# Patient Record
Sex: Male | Born: 1955 | Race: Black or African American | Hispanic: No | Marital: Single | Smoking: Never smoker
Health system: Southern US, Community
[De-identification: ages and names within clinical notes are randomized; demographics above are authoritative.]

## PROBLEM LIST (undated history)

## (undated) DIAGNOSIS — N201 Calculus of ureter: Secondary | ICD-10-CM

## (undated) DIAGNOSIS — R002 Palpitations: Secondary | ICD-10-CM

## (undated) DIAGNOSIS — I10 Essential (primary) hypertension: Secondary | ICD-10-CM

## (undated) DIAGNOSIS — G039 Meningitis, unspecified: Secondary | ICD-10-CM

---

## 2004-07-07 ENCOUNTER — Emergency Department: Payer: Self-pay | Admitting: Emergency Medicine

## 2005-07-11 ENCOUNTER — Other Ambulatory Visit: Payer: Self-pay

## 2005-07-11 ENCOUNTER — Observation Stay: Payer: Self-pay

## 2005-12-30 ENCOUNTER — Emergency Department: Payer: Self-pay | Admitting: Emergency Medicine

## 2005-12-30 ENCOUNTER — Other Ambulatory Visit: Payer: Self-pay

## 2009-03-05 ENCOUNTER — Emergency Department: Payer: Self-pay | Admitting: Emergency Medicine

## 2009-06-07 ENCOUNTER — Emergency Department: Payer: Self-pay | Admitting: Emergency Medicine

## 2009-10-06 ENCOUNTER — Emergency Department: Payer: Self-pay | Admitting: Internal Medicine

## 2009-11-13 ENCOUNTER — Emergency Department: Payer: Self-pay | Admitting: Internal Medicine

## 2009-11-25 ENCOUNTER — Emergency Department: Payer: Self-pay | Admitting: Internal Medicine

## 2011-02-21 IMAGING — CR DG ABDOMEN 1V
1 series · 1 of 1 positions shown · non-contrast
Comparison: none

REASON FOR EXAM: r flank pain
COMMENTS:

[view not recorded]
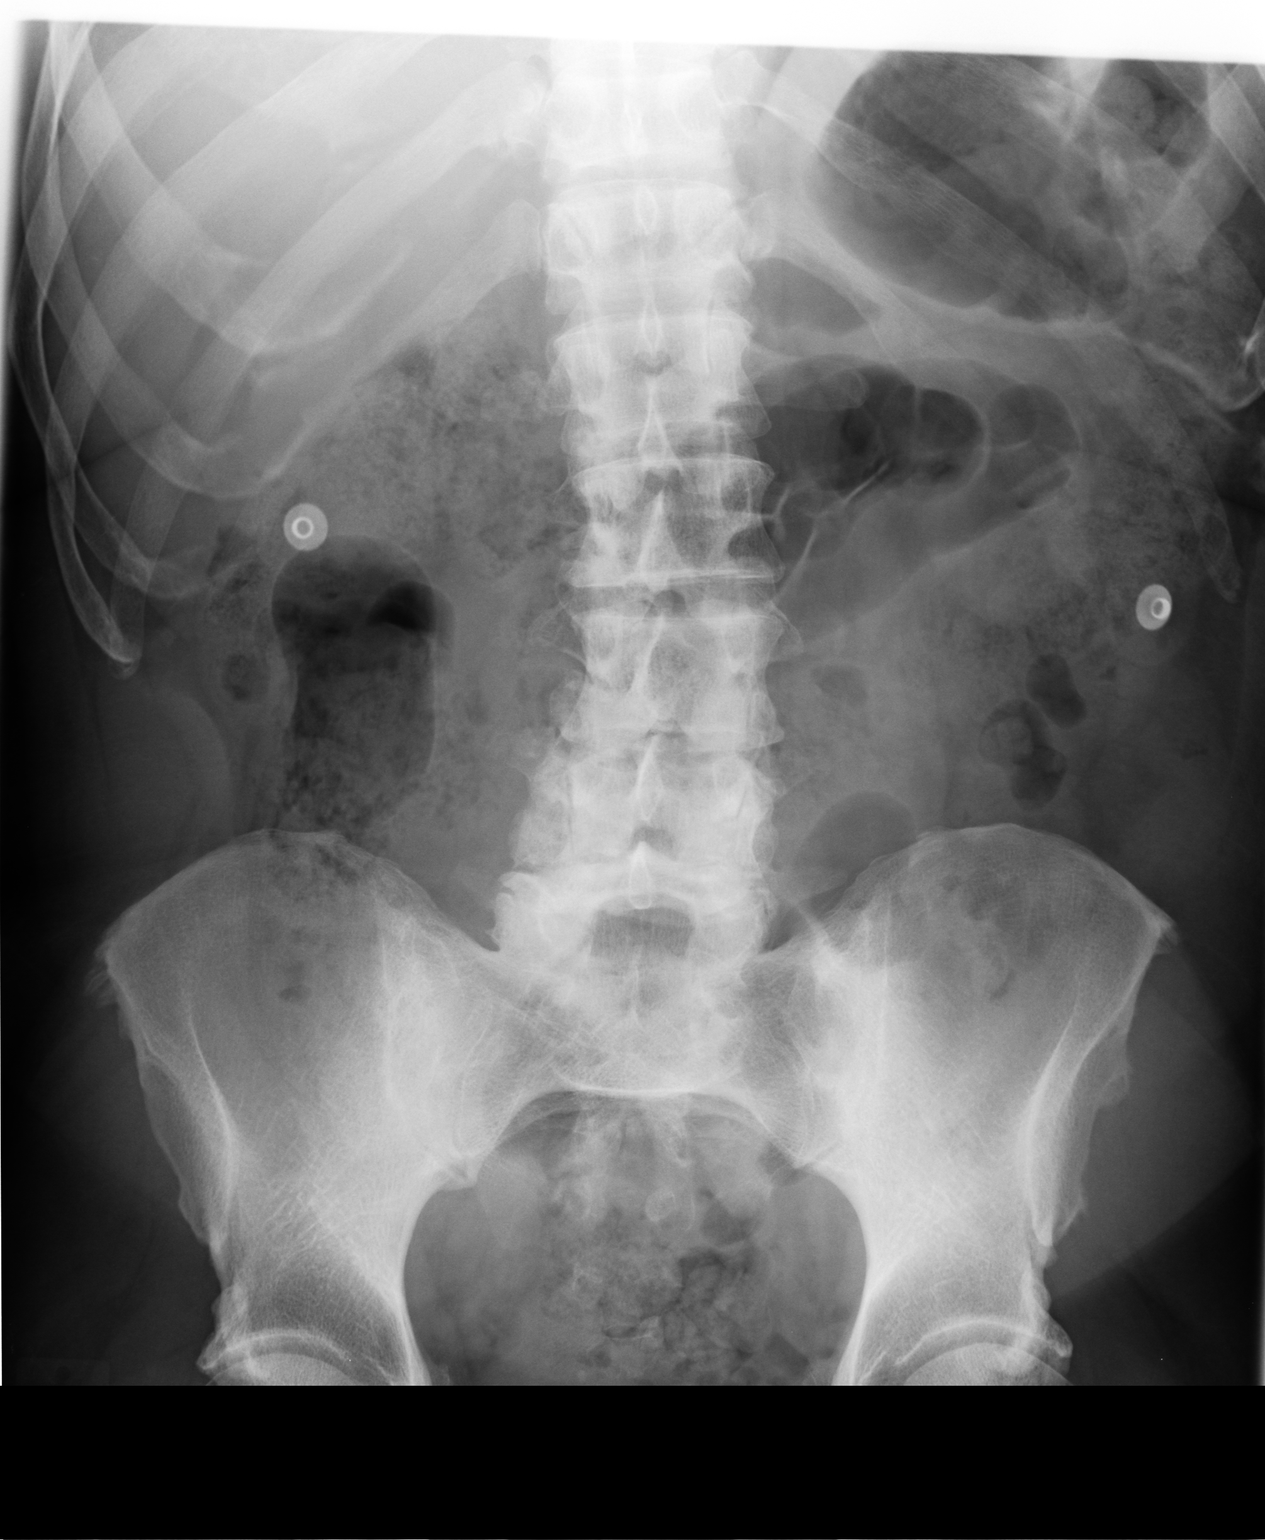

[1 of 1 positions shown; findings below may reference images not displayed]

PROCEDURE:     DXR - DXR KIDNEY URETER BLADDER  - November 13, 2009 [DATE]

RESULT:     The bowel gas pattern is relatively nonspecific. There is likely
an element of constipation present. I do not see abnormal calcifications in
the region of the kidneys. There are degenerative changes of the lower
lumbar spine. The SI joints appear sclerotic.
IMPRESSION: The bowel gas pattern suggests constipation. I see no
evidence of calcified kidney stones.

## 2011-02-21 IMAGING — CR DG CHEST 1V PORT
1 series · 1 of 1 positions shown · non-contrast
Comparison: none

REASON FOR EXAM: Chest Pain
COMMENTS:

[view not recorded]
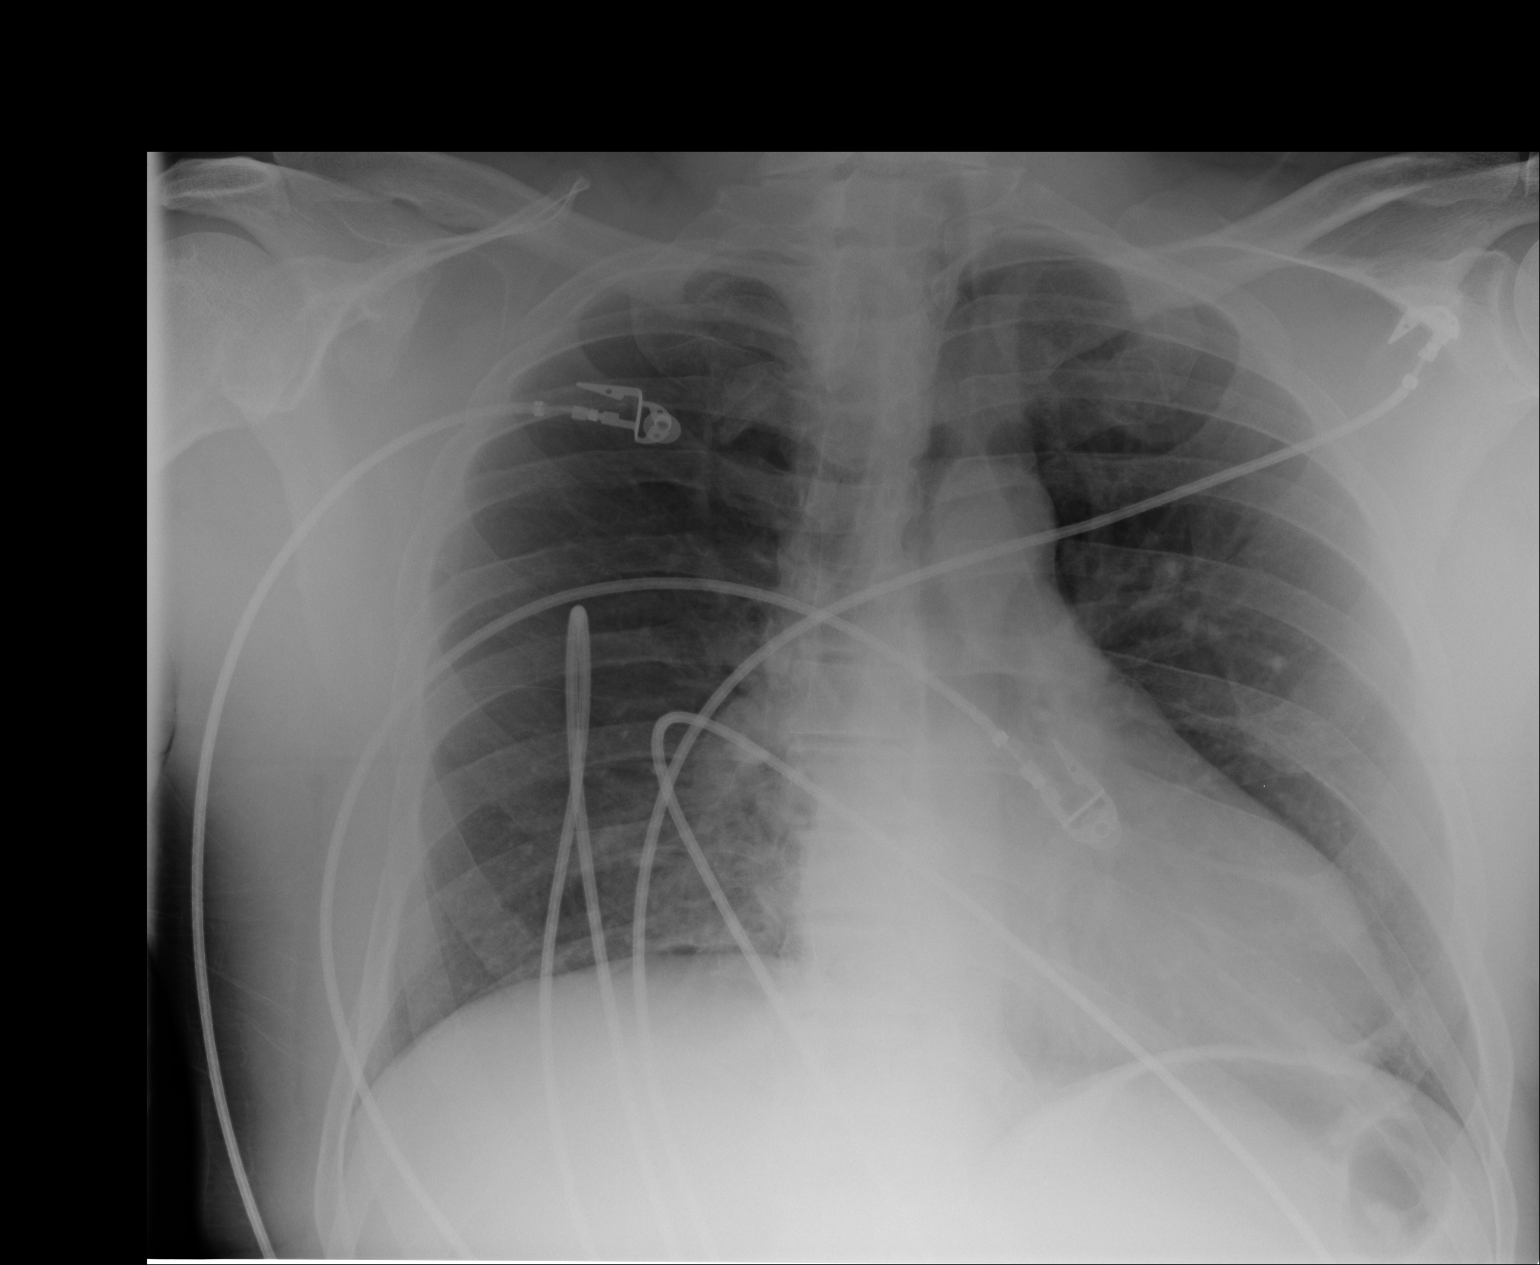

[1 of 1 positions shown; findings below may reference images not displayed]

PROCEDURE:     DXR - DXR PORTABLE CHEST SINGLE VIEW  - November 13, 2009 [DATE]

RESULT:     Comparison is made to a study 05 March, 2009.

The lungs are adequately inflated. The interstitial markings are mildly
prominent in the perihilar regions and in the right infrahilar region. The
cardiac silhouette is not enlarged. The pulmonary vascularity is not
engorged.
IMPRESSION: The perihilar lung markings and right infrahilar lung
markings are prominent. This may reflect subsegmental atelectasis. I see no
overt evidence of CHF nor of pneumonia.

## 2011-08-12 ENCOUNTER — Emergency Department: Payer: Self-pay | Admitting: Emergency Medicine

## 2012-11-30 ENCOUNTER — Emergency Department (HOSPITAL_COMMUNITY): Payer: Worker's Compensation

## 2012-11-30 ENCOUNTER — Encounter (HOSPITAL_COMMUNITY): Payer: Self-pay

## 2012-11-30 ENCOUNTER — Emergency Department (HOSPITAL_COMMUNITY)
Admission: EM | Admit: 2012-11-30 | Discharge: 2012-11-30 | Disposition: A | Discharge status: Home / Self Care | Payer: Worker's Compensation | Attending: Emergency Medicine | Admitting: Emergency Medicine

## 2012-11-30 DIAGNOSIS — K089 Disorder of teeth and supporting structures, unspecified: Secondary | ICD-10-CM | POA: Insufficient documentation

## 2012-11-30 DIAGNOSIS — Z7982 Long term (current) use of aspirin: Secondary | ICD-10-CM | POA: Insufficient documentation

## 2012-11-30 DIAGNOSIS — S161XXA Strain of muscle, fascia and tendon at neck level, initial encounter: Secondary | ICD-10-CM

## 2012-11-30 DIAGNOSIS — K047 Periapical abscess without sinus: Secondary | ICD-10-CM | POA: Insufficient documentation

## 2012-11-30 DIAGNOSIS — Z79899 Other long term (current) drug therapy: Secondary | ICD-10-CM | POA: Insufficient documentation

## 2012-11-30 DIAGNOSIS — S060X9A Concussion with loss of consciousness of unspecified duration, initial encounter: Secondary | ICD-10-CM | POA: Insufficient documentation

## 2012-11-30 DIAGNOSIS — Y929 Unspecified place or not applicable: Secondary | ICD-10-CM | POA: Insufficient documentation

## 2012-11-30 DIAGNOSIS — S139XXA Sprain of joints and ligaments of unspecified parts of neck, initial encounter: Secondary | ICD-10-CM | POA: Insufficient documentation

## 2012-11-30 DIAGNOSIS — S069X9A Unspecified intracranial injury with loss of consciousness of unspecified duration, initial encounter: Secondary | ICD-10-CM

## 2012-11-30 DIAGNOSIS — X500XXA Overexertion from strenuous movement or load, initial encounter: Secondary | ICD-10-CM | POA: Insufficient documentation

## 2012-11-30 DIAGNOSIS — Y939 Activity, unspecified: Secondary | ICD-10-CM | POA: Insufficient documentation

## 2012-11-30 DIAGNOSIS — S79919A Unspecified injury of unspecified hip, initial encounter: Secondary | ICD-10-CM | POA: Insufficient documentation

## 2012-11-30 DIAGNOSIS — IMO0002 Reserved for concepts with insufficient information to code with codable children: Secondary | ICD-10-CM | POA: Insufficient documentation

## 2012-11-30 DIAGNOSIS — I10 Essential (primary) hypertension: Secondary | ICD-10-CM | POA: Insufficient documentation

## 2012-11-30 HISTORY — DX: Essential (primary) hypertension: I10

## 2012-11-30 MED ORDER — AMOXICILLIN 500 MG PO CAPS: 500.0000 mg | ORAL_CAPSULE | Freq: Three times a day (TID) | ORAL | Status: DC

## 2012-11-30 MED ORDER — HYDROCODONE-ACETAMINOPHEN 5-325 MG PO TABS: 1.0000 | ORAL_TABLET | ORAL | Status: DC | PRN

## 2012-11-30 MED ORDER — AMOXICILLIN 250 MG PO CAPS
500.0000 mg | ORAL_CAPSULE | Freq: Once | ORAL | Status: AC
  Administered 2012-11-30: 500 mg via ORAL
  Filled 2012-11-30: qty 2

## 2012-11-30 MED ORDER — IBUPROFEN 600 MG PO TABS: 600.0000 mg | ORAL_TABLET | Freq: Four times a day (QID) | ORAL | Status: DC | PRN

## 2012-11-30 NOTE — Discharge Instructions (Signed)
Return here for any problems or concerns.  You xrays are ok today.  Take the antibiotic prescribed for your dental infection and plan to see a dentist for definitive treatment of this infected tooth.

## 2012-11-30 NOTE — ED Notes (Signed)
Larey Seat this morning when I got off work around 0600 walking across the parking lot at work. Hit head on the concrete and heard neck pop. States that he blacked out for a minute. States that he is having pain in neck, right hip, and low back.

## 2012-12-03 NOTE — ED Provider Notes (Signed)
History     CSN: [id number]  Arrival date & time 11/30/12  Barry Brunner   First MD Initiated Contact with Patient 11/30/12 2119      Chief Complaint  Patient presents with  . Fall  . Back Pain  . Hip Pain  . Neck Pain    (Consider location/radiation/quality/duration/timing/severity/associated sxs/prior treatment) HPI Comments: James Daugherty is a 57 y.o. Male presenting for evaluation of persistent pain in his neck,  Lower back and right hip after falling on ice 12 hours ago while walking out of work.  He reports a brief loc as witnessed by his coworkers although he also remembers feeling his neck "pop" at the time of the fall.  He has persistent pain and worsening stiffness of his neck and lower back and also reports soreness at his right lateral hip with palpation, but denies pain with weight bearing and range of motion of the hip. He has had no headaches,  Confusion,  Vision changes, dizziness or focal weakness since the event including no numbness or weakness in his upper or lower extremities.  He has maintained a normal appetite today without nausea or vomiting.  He also denies leg numbness,  Weakness and has noted no problems with urination bowel movements, denies saddle anesthesia.  He also reports has had right dental pain and swelling along his right lower jawline which has worsened today.  He denies fevers.  Patient is a 57 y.o. male presenting with back pain, hip pain, and neck pain. The history is provided by the patient.  Back Pain Associated symptoms: no abdominal pain, no chest pain, no dysuria, no fever, no headaches, no numbness and no weakness   Hip Pain Associated symptoms include neck pain. Pertinent negatives include no abdominal pain, chest pain, fever, headaches, joint swelling, numbness, rash, sore throat or weakness.  Neck Pain Associated symptoms: no chest pain, no fever, no headaches, no numbness and no weakness     Past Medical History  Diagnosis Date  .  Hypertension     History reviewed. No pertinent past surgical history.  History reviewed. No pertinent family history.  History  Substance Use Topics  . Smoking status: Never Smoker   . Smokeless tobacco: Not on file  . Alcohol Use: No      Review of Systems  Constitutional: Negative for fever.  HENT: Positive for neck pain and dental problem. Negative for sore throat, facial swelling and neck stiffness.   Respiratory: Negative for shortness of breath.   Cardiovascular: Negative for chest pain and leg swelling.  Gastrointestinal: Negative for abdominal pain, constipation and abdominal distention.  Genitourinary: Negative for dysuria, urgency, frequency, flank pain and difficulty urinating.  Musculoskeletal: Positive for back pain. Negative for joint swelling and gait problem.  Skin: Negative for rash.  Neurological: Negative for dizziness, speech difficulty, weakness, light-headedness, numbness and headaches.    Allergies  Review of patient's allergies indicates no known allergies.  Home Medications   Current Outpatient Rx  Name  Route  Sig  Dispense  Refill  . aspirin EC 81 MG tablet   Oral   Take 81 mg by mouth 2 (two) times daily.         Marland Kitchen ibuprofen (ADVIL,MOTRIN) 200 MG tablet   Oral   Take 1,200 mg by mouth once as needed for pain.         . metoprolol succinate (TOPROL-XL) 25 MG 24 hr tablet   Oral   Take 25 mg by mouth daily.         Marland Kitchen  amoxicillin (AMOXIL) 500 MG capsule   Oral   Take 1 capsule (500 mg total) by mouth 3 (three) times daily.   30 capsule   0   . HYDROcodone-acetaminophen (NORCO/VICODIN) 5-325 MG per tablet   Oral   Take 1 tablet by mouth every 4 (four) hours as needed for pain.   12 tablet   0   . ibuprofen (ADVIL,MOTRIN) 600 MG tablet   Oral   Take 1 tablet (600 mg total) by mouth every 6 (six) hours as needed for pain.   30 tablet   0     BP 143/88  Pulse 75  Temp(Src) 98.2 F (36.8 C) (Oral)  Resp 20  Ht 5\' 10"   (1.778 m)  Wt 185 lb (83.915 kg)  BMI 26.54 kg/m2  SpO2 100%  Physical Exam  Nursing note and vitals reviewed. Constitutional: He is oriented to person, place, and time. He appears well-developed and well-nourished. No distress.  HENT:  Head: Normocephalic and atraumatic. Head is without raccoon's eyes, without Battle's sign and without contusion.  Right Ear: Tympanic membrane and external ear normal.  Left Ear: Tympanic membrane and external ear normal.  Mouth/Throat: Oropharynx is clear and moist and mucous membranes are normal. No oral lesions. Dental abscesses present.    Eyes: Conjunctivae and EOM are normal. Pupils are equal, round, and reactive to light.  Neck: Normal range of motion. Neck supple. Muscular tenderness present. No spinous process tenderness present. No rigidity. Normal range of motion present.  Cardiovascular: Normal rate, normal heart sounds and intact distal pulses.   Pedal pulses normal.  Pulmonary/Chest: Effort normal.  Abdominal: Soft. Bowel sounds are normal. He exhibits no distension and no mass. There is no tenderness.  Musculoskeletal: Normal range of motion. He exhibits no edema.       Right hip: He exhibits normal range of motion, normal strength, no bony tenderness, no swelling, no crepitus and no deformity.       Lumbar back: He exhibits tenderness. He exhibits no bony tenderness, no swelling, no edema and no spasm.  Bilateral paralumbar ttp.  Lymphadenopathy:    He has no cervical adenopathy.  Neurological: He is alert and oriented to person, place, and time. He has normal strength. He displays no atrophy and no tremor. No sensory deficit. Gait normal. GCS eye subscore is 4. GCS verbal subscore is 5. GCS motor subscore is 6.  Reflex Scores:      Patellar reflexes are 2+ on the right side and 2+ on the left side.      Achilles reflexes are 2+ on the right side and 2+ on the left side. No strength deficit noted in hip and knee flexor and extensor  muscle groups.  Ankle flexion and extension intact.  Skin: Skin is warm and dry. No rash noted. No erythema.  Psychiatric: He has a normal mood and affect. His speech is normal and behavior is normal. Thought content normal. Cognition and memory are normal.    ED Course  Procedures (including critical care time)  Labs Reviewed - No data to display No results found.   1. Minor head injury with loss of consciousness, initial encounter   2. Cervical strain, acute, initial encounter   3. Dental abscess       MDM   Patients labs and/or radiological studies were reviewed during the medical decision making and disposition process. No injury per xrays obtained. Discusse pro and con of head Ct scan.  Given injury 12 hours old,  And with no neuro deficits on exam,  No indication for ct at this time.  Discussed with Dr. Deretha Emory who also agrees.  Patient also prefers to not have ct scan unless necessary.  He was prescribed amoxil for his dental abscess,  Also prescribed ibuprofen and hydrocodone.  Referrals given for f/u care.  The patient appears reasonably screened and/or stabilized for discharge and I doubt any other medical condition or other [facility] requiring further screening, evaluation, or treatment in the ED at this time prior to discharge.        Burgess Amor, PA-C 12/03/12 1432

## 2012-12-04 NOTE — ED Provider Notes (Signed)
Medical screening examination/treatment/procedure(s) were performed by non-physician practitioner and as supervising physician I was immediately available for consultation/collaboration.   Scott W. Zackowski, MD 12/04/12 1943 

## 2014-06-23 ENCOUNTER — Encounter (HOSPITAL_COMMUNITY): Payer: Self-pay | Admitting: Emergency Medicine

## 2014-06-23 ENCOUNTER — Emergency Department (HOSPITAL_COMMUNITY)
Admission: EM | Admit: 2014-06-23 | Discharge: 2014-06-23 | Disposition: A | Discharge status: Home / Self Care | Payer: BC Managed Care – PPO | Attending: Emergency Medicine | Admitting: Emergency Medicine

## 2014-06-23 ENCOUNTER — Emergency Department (HOSPITAL_COMMUNITY): Payer: BC Managed Care – PPO

## 2014-06-23 DIAGNOSIS — I10 Essential (primary) hypertension: Secondary | ICD-10-CM | POA: Diagnosis not present

## 2014-06-23 DIAGNOSIS — Z79899 Other long term (current) drug therapy: Secondary | ICD-10-CM | POA: Diagnosis not present

## 2014-06-23 DIAGNOSIS — Z792 Long term (current) use of antibiotics: Secondary | ICD-10-CM | POA: Insufficient documentation

## 2014-06-23 DIAGNOSIS — R109 Unspecified abdominal pain: Secondary | ICD-10-CM | POA: Diagnosis present

## 2014-06-23 DIAGNOSIS — Z87442 Personal history of urinary calculi: Secondary | ICD-10-CM | POA: Insufficient documentation

## 2014-06-23 DIAGNOSIS — Z7982 Long term (current) use of aspirin: Secondary | ICD-10-CM | POA: Diagnosis not present

## 2014-06-23 DIAGNOSIS — N23 Unspecified renal colic: Secondary | ICD-10-CM | POA: Diagnosis not present

## 2014-06-23 HISTORY — DX: Calculus of ureter: N20.1

## 2014-06-23 LAB — URINALYSIS, ROUTINE W REFLEX MICROSCOPIC
BILIRUBIN URINE: NEGATIVE
GLUCOSE, UA: NEGATIVE mg/dL
Ketones, ur: NEGATIVE mg/dL
Leukocytes, UA: NEGATIVE
Nitrite: NEGATIVE
SPECIFIC GRAVITY, URINE: 1.02 (ref 1.005–1.030)
Urobilinogen, UA: 0.2 mg/dL (ref 0.0–1.0)
pH: 5.5 (ref 5.0–8.0)

## 2014-06-23 LAB — URINE MICROSCOPIC-ADD ON

## 2014-06-23 MED ORDER — OXYCODONE-ACETAMINOPHEN 5-325 MG PO TABS: 1.0000 | ORAL_TABLET | ORAL | Status: DC | PRN

## 2014-06-23 NOTE — ED Notes (Signed)
Pt c/o left flank pain

## 2014-06-23 NOTE — ED Provider Notes (Signed)
CSN: [id number]     Arrival date & time 06/23/14  0143 History   First MD Initiated Contact with Patient 06/23/14 on patient's arrival to the room.   Chief Complaint  Patient presents with  . Flank Pain     (Consider location/radiation/quality/duration/timing/severity/associated sxs/prior Treatment) HPI This is a 58 year old male with a history of kidney stones. He is here with left flank pain that began suddenly about 11 PM yesterday evening. The pain was moderate to severe. It is characterized as like previous kidney stone. It was not associated with nausea or vomiting. It was associated with some difficulty urinating and hematuria. He was given fentanyl IV by EMS prior to arrival with complete resolution of his pain. He's been observed in the ED for several hours and continues to remain pain-free.  Past Medical History  Diagnosis Date  . Hypertension   . Ureterolithiasis    History reviewed. No pertinent past surgical history. History reviewed. No pertinent family history. History  Substance Use Topics  . Smoking status: Never Smoker   . Smokeless tobacco: Not on file  . Alcohol Use: No    Review of Systems  All other systems reviewed and are negative.   Allergies  Review of patient's allergies indicates no known allergies.  Home Medications   Prior to Admission medications   Medication Sig Start Date End Date Taking? Authorizing Provider  amoxicillin (AMOXIL) 500 MG capsule Take 1 capsule (500 mg total) by mouth 3 (three) times daily. 11/30/12   Burgess AmorJulie Idol, PA-C  aspirin EC 81 MG tablet Take 81 mg by mouth 2 (two) times daily.    Historical Provider, MD  HYDROcodone-acetaminophen (NORCO/VICODIN) 5-325 MG per tablet Take 1 tablet by mouth every 4 (four) hours as needed for pain. 11/30/12   Burgess AmorJulie Idol, PA-C  ibuprofen (ADVIL,MOTRIN) 200 MG tablet Take 1,200 mg by mouth once as needed for pain.    Historical Provider, MD  ibuprofen (ADVIL,MOTRIN) 600 MG tablet Take 1 tablet  (600 mg total) by mouth every 6 (six) hours as needed for pain. 11/30/12   Burgess AmorJulie Idol, PA-C  metoprolol succinate (TOPROL-XL) 25 MG 24 hr tablet Take 25 mg by mouth daily.    Historical Provider, MD   BP 142/91  Pulse 92  Temp(Src) 97.7 F (36.5 C)  Resp 20  Ht 5\' 9"  (1.753 m)  Wt 195 lb (88.451 kg)  BMI 28.78 kg/m2  SpO2 99%  Physical Exam General: Well-developed, well-nourished male in no acute distress; appearance consistent with age of record HENT: normocephalic; atraumatic Eyes: pupils equal, round and reactive to light; extraocular muscles intact Neck: supple Heart: regular rate and rhythm Lungs: clear to auscultation bilaterally Abdomen: soft; nondistended; nontender; no masses or hepatosplenomegaly; bowel sounds present GU: no CVA tenderness Extremities: No deformity; full range of motion; pulses normal Neurologic: Awake, alert and oriented; motor function intact in all extremities and symmetric; no facial droop Skin: Warm and dry Psychiatric: Normal mood and affect    ED Course  Procedures (including critical care time)  MDM   Nursing notes and vitals signs, including pulse oximetry, reviewed.  Summary of this visit's results, reviewed by myself:  Labs:  Results for orders placed during the hospital encounter of 06/23/14 (from the past 24 hour(s))  URINALYSIS, ROUTINE W REFLEX MICROSCOPIC     Status: Abnormal   Collection Time    06/23/14  1:50 AM      Result Value Ref Range   Color, Urine YELLOW  YELLOW   APPearance  CLEAR  CLEAR   Specific Gravity, Urine 1.020  1.005 - 1.030   pH 5.5  5.0 - 8.0   Glucose, UA NEGATIVE  NEGATIVE mg/dL   Hgb urine dipstick LARGE (*) NEGATIVE   Bilirubin Urine NEGATIVE  NEGATIVE   Ketones, ur NEGATIVE  NEGATIVE mg/dL   Protein, ur TRACE (*) NEGATIVE mg/dL   Urobilinogen, UA 0.2  0.0 - 1.0 mg/dL   Nitrite NEGATIVE  NEGATIVE   Leukocytes, UA NEGATIVE  NEGATIVE  URINE MICROSCOPIC-ADD ON     Status: Abnormal   Collection Time     06/23/14  1:50 AM      Result Value Ref Range   Squamous Epithelial / LPF RARE  RARE   WBC, UA 0-2  <3 WBC/hpf   RBC / HPF TOO NUMEROUS TO COUNT  <3 RBC/hpf   Bacteria, UA MANY (*) RARE   Casts GRANULAR CAST (*) NEGATIVE   Urine-Other MUCOUS PRESENT          Hanley Seamen, MD 06/23/14 [phone]

## 2014-06-23 NOTE — Discharge Instructions (Signed)

## 2015-04-12 ENCOUNTER — Telehealth: Payer: Self-pay

## 2015-04-12 NOTE — Telephone Encounter (Signed)
Patient called with work schedule and he is off aug 3 and 4th    Also off aug 8 and 9th

## 2015-04-12 NOTE — Telephone Encounter (Signed)
[phone]  PATIENT CALLED TO SCHEDULE A COLONOSCOPY   RECEIVED LETTER

## 2015-04-14 NOTE — Telephone Encounter (Signed)
Tried to call pt to give appt date and time and could not leave a message on his phone.

## 2015-04-14 NOTE — Telephone Encounter (Signed)
See separate triage.  

## 2015-04-14 NOTE — Telephone Encounter (Signed)
Gastroenterology Pre-Procedure Review  Request Date: 04/12/2015 Requesting Physician: Karrie DoffingWilliam Selvidge, MD    PATIENT REVIEW QUESTIONS: The patient responded to the following health history questions as indicated:    1. Diabetes Melitis: no 2. Joint replacements in the past 12 months: no 3. Major health problems in the past 3 months: no 4. Has an artificial valve or MVP: no 5. Has a defibrillator: no 6. Has been advised in past to take antibiotics in advance of a procedure like teeth cleaning: no    MEDICATIONS & ALLERGIES:    Patient reports the following regarding taking any blood thinners:   Plavix? no Aspirin? YES Coumadin? no  Patient confirms/reports the following medications:  Current Outpatient Prescriptions  Medication Sig Dispense Refill  . aspirin EC 81 MG tablet Take 81 mg by mouth 2 (two) times daily. Takes one tablet every other day    . b complex vitamins tablet Take 1 tablet by mouth daily.    . folic acid (FOLVITE) 800 MCG tablet Take 400 mcg by mouth daily. Takes one tablet every other day    . Multiple Vitamin (MULTIVITAMIN) tablet Take 1 tablet by mouth daily.    . NON FORMULARY Co Q 10     One daily    . NON FORMULARY Vitamin A    . NON FORMULARY Beta Carotene    . amoxicillin (AMOXIL) 500 MG capsule Take 1 capsule (500 mg total) by mouth 3 (three) times daily. (Patient not taking: Reported on 04/12/2015) 30 capsule 0  . HYDROcodone-acetaminophen (NORCO/VICODIN) 5-325 MG per tablet Take 1 tablet by mouth every 4 (four) hours as needed for pain. (Patient not taking: Reported on 04/12/2015) 12 tablet 0  . ibuprofen (ADVIL,MOTRIN) 200 MG tablet Take 1,200 mg by mouth once as needed for pain.    Marland Kitchen. ibuprofen (ADVIL,MOTRIN) 600 MG tablet Take 1 tablet (600 mg total) by mouth every 6 (six) hours as needed for pain. (Patient not taking: Reported on 04/12/2015) 30 tablet 0  . metoprolol succinate (TOPROL-XL) 25 MG 24 hr tablet Take 25 mg by mouth daily.    Marland Kitchen.  oxyCODONE-acetaminophen (PERCOCET) 5-325 MG per tablet Take 1-2 tablets by mouth every 4 (four) hours as needed (for pain). (Patient not taking: Reported on 04/12/2015) 30 tablet 0   No current facility-administered medications for this visit.    Patient confirms/reports the following allergies:  No Known Allergies  No orders of the defined types were placed in this encounter.    AUTHORIZATION INFORMATION Primary Insurance:   ID #:   Group #:  Pre-Cert / Auth required:  Pre-Cert / Auth #:  Secondary Insurance:  ID #:   Group #:  Pre-Cert / Auth required:  Pre-Cert / Auth #:   SCHEDULE INFORMATION: Procedure has been scheduled as follows:  Date:             Time:   Location:   This Gastroenterology Pre-Precedure Review Form is being routed to the following provider(s): Jonette EvaSandi Fields, MD

## 2015-04-14 NOTE — Telephone Encounter (Signed)
SUPREP SPLIT DOSING- FULL LIQUIDS WITH BREAKFAST.   Full Liquid Diet A high-calorie, high-protein supplement should be used to meet your nutritional requirements when the full liquid diet is continued for more than 2 or 3 days. If this diet is to be used for an extended period of time (more than 7 days), a multivitamin should be considered.  Breads and Starches  Allowed: None are allowed except crackers WHOLE OR pureed (made into a thick, smooth soup) in soup.   Avoid: Any others.    Potatoes/Pasta/Rice  Allowed: ANY ITEM AS A SOUP OR SMALL PLATE OF MASHED POTATOES.       Vegetables  Allowed: Strained tomato or vegetable juice. Vegetables pureed in soup.   Avoid: Any others.    Fruit  Allowed: Any strained fruit juices and fruit drinks. Include 1 serving of citrus or vitamin C-enriched fruit juice daily.   Avoid: Any others.  Meat and Meat Substitutes  Allowed: Egg  Avoid: Any meat, fish, or fowl. All cheese.  Milk  Allowed: Milk beverages, including milk shakes and instant breakfast mixes. Smooth yogurt.   Avoid: Any others. Avoid dairy products if not tolerated.    Soups and Combination Foods  Allowed: Broth, strained cream soups. Strained, broth-based soups.   Avoid: Any others.    Desserts and Sweets  Allowed: flavored gelatin,plain ice cream, sherbet, smooth pudding, junket, fruit ices, frozen ice pops, pudding pops,, frozen fudge pops, chocolate syrup. Sugar, honey, jelly, syrup.   Avoid: Any others.  Fats and Oils  Allowed: Margarine, butter, cream, sour cream, oils.   Avoid: Any others.  Beverages  Allowed: All.   Avoid: None.  Condiments  Allowed: Iodized salt, pepper, spices, flavorings. Cocoa powder.   Avoid: Any others.    SAMPLE MEAL PLAN Breakfast   cup orange juice.   1 OR 2 EGGS   1 cup  milk.   1 cup beverage (coffee or tea).   Cream or sugar, if desired.    Midmorning Snack  2 SCRAMBLED OR HARD BOILED  EGG   Lunch  1 cup cream soup.    cup fruit juice.   1 cup milk.    cup custard.   1 cup beverage (coffee or tea).   Cream or sugar, if desired.    Midafternoon Snack  1 cup milk shake.  Dinner  1 cup cream soup.    cup fruit juice.   1 cup milk.    cup pudding.   1 cup beverage (coffee or tea).   Cream or sugar, if desired.  Evening Snack  1 cup supplement.  To increase calories, add sugar, cream, butter, or margarine if possible. Nutritional supplements will also increase the total calories.  

## 2015-04-20 ENCOUNTER — Other Ambulatory Visit: Payer: Self-pay

## 2015-04-20 DIAGNOSIS — Z1211 Encounter for screening for malignant neoplasm of colon: Secondary | ICD-10-CM

## 2015-04-20 MED ORDER — NA SULFATE-K SULFATE-MG SULF 17.5-3.13-1.6 GM/177ML PO SOLN: 1.0000 | ORAL | Status: DC

## 2015-04-20 NOTE — Telephone Encounter (Signed)
Pt called and has been scheduled for 04/29/2015 with Dr. Darrick Penna at 10:45 AM.  I am sending the Rx to pharmacy and also faxing his instructions to pharmacy.

## 2015-04-23 NOTE — Telephone Encounter (Signed)
Instructions faxed to pharmacy and James Daugherty was aware to put with the prep.

## 2015-04-26 ENCOUNTER — Telehealth: Payer: Self-pay

## 2015-04-26 NOTE — Telephone Encounter (Signed)
I called [employer] @ [phone] and spoke to [facility] who said that a PA is not required for screening colonoscopy done as out patient.

## 2015-04-29 ENCOUNTER — Encounter (HOSPITAL_COMMUNITY): Payer: Self-pay | Admitting: *Deleted

## 2015-04-29 ENCOUNTER — Encounter (HOSPITAL_COMMUNITY)
Admission: RE | Disposition: A | Discharge status: Home / Self Care | Payer: Self-pay | Source: Ambulatory Visit | Attending: Gastroenterology

## 2015-04-29 ENCOUNTER — Ambulatory Visit (HOSPITAL_COMMUNITY)
Admission: RE | Admit: 2015-04-29 | Discharge: 2015-04-29 | Disposition: A | Discharge status: Home / Self Care | Payer: BC Managed Care – PPO | Source: Ambulatory Visit | Attending: Gastroenterology | Admitting: Gastroenterology

## 2015-04-29 DIAGNOSIS — Z1211 Encounter for screening for malignant neoplasm of colon: Secondary | ICD-10-CM | POA: Insufficient documentation

## 2015-04-29 DIAGNOSIS — K648 Other hemorrhoids: Secondary | ICD-10-CM | POA: Insufficient documentation

## 2015-04-29 DIAGNOSIS — Z7982 Long term (current) use of aspirin: Secondary | ICD-10-CM | POA: Diagnosis not present

## 2015-04-29 DIAGNOSIS — I10 Essential (primary) hypertension: Secondary | ICD-10-CM | POA: Diagnosis not present

## 2015-04-29 DIAGNOSIS — K5730 Diverticulosis of large intestine without perforation or abscess without bleeding: Secondary | ICD-10-CM

## 2015-04-29 HISTORY — DX: Palpitations: R00.2

## 2015-04-29 HISTORY — DX: Meningitis, unspecified: G03.9

## 2015-04-29 HISTORY — PX: COLONOSCOPY: SHX5424

## 2015-04-29 SURGERY — COLONOSCOPY
Anesthesia: Moderate Sedation

## 2015-04-29 MED ORDER — STERILE WATER FOR IRRIGATION IR SOLN
Status: DC | PRN
  Administered 2015-04-29: 10:00:00

## 2015-04-29 MED ORDER — SODIUM CHLORIDE 0.9 % IV SOLN
INTRAVENOUS | Status: DC
  Administered 2015-04-29: 10:00:00 via INTRAVENOUS

## 2015-04-29 MED ORDER — MIDAZOLAM HCL 5 MG/5ML IJ SOLN
INTRAMUSCULAR | Status: DC | PRN
  Administered 2015-04-29 (×2): 2 mg via INTRAVENOUS
  Administered 2015-04-29: 1 mg via INTRAVENOUS

## 2015-04-29 MED ORDER — MEPERIDINE HCL 100 MG/ML IJ SOLN
INTRAMUSCULAR | Status: DC | PRN
  Administered 2015-04-29: 50 mg via INTRAVENOUS
  Administered 2015-04-29: 25 mg via INTRAVENOUS

## 2015-04-29 MED ORDER — MEPERIDINE HCL 100 MG/ML IJ SOLN
INTRAMUSCULAR | Status: AC
  Filled 2015-04-29: qty 2

## 2015-04-29 MED ORDER — MIDAZOLAM HCL 5 MG/5ML IJ SOLN
INTRAMUSCULAR | Status: AC
  Filled 2015-04-29: qty 10

## 2015-04-29 NOTE — Discharge Instructions (Signed)
You have small internal hemorrhoids and diverticulosis IN YOUR RIGHT AND LEFT COLON.   DRINK WATER TO KEEP YOUR URINE LIGHT YELLOW.  FOLLOW A HIGH FIBER DIET. AVOID ITEMS THAT CAUSE BLOATING & GAS. See info below.  Next colonoscopy in 10 years.  Colonoscopy Care After Read the instructions outlined below and refer to this sheet in the next week. These discharge instructions provide you with general information on caring for yourself after you leave the hospital. While your treatment has been planned according to the most current medical practices available, unavoidable complications occasionally occur. If you have any problems or questions after discharge, call DR. Mahogany Torrance, [phone].  ACTIVITY  You may resume your regular activity, but move at a slower pace for the next 24 hours.   Take frequent rest periods for the next 24 hours.   Walking will help get rid of the air and reduce the bloated feeling in your belly (abdomen).   No driving for 24 hours (because of the medicine (anesthesia) used during the test).   You may shower.   Do not sign any important legal documents or operate any machinery for 24 hours (because of the anesthesia used during the test).    NUTRITION  Drink plenty of fluids.   You may resume your normal diet as instructed by your doctor.   Begin with a light meal and progress to your normal diet. Heavy or fried foods are harder to digest and may make you feel sick to your stomach (nauseated).   Avoid alcoholic beverages for 24 hours or as instructed.    MEDICATIONS  You may resume your normal medications.   WHAT YOU CAN EXPECT TODAY  Some feelings of bloating in the abdomen.   Passage of more gas than usual.   Spotting of blood in your stool or on the toilet paper  .  IF YOU HAD POLYPS REMOVED DURING THE COLONOSCOPY:  Eat a soft diet IF YOU HAVE NAUSEA, BLOATING, ABDOMINAL PAIN, OR VOMITING.    FINDING OUT THE RESULTS OF YOUR TEST Not  all test results are available during your visit. DR. Darrick Penna WILL CALL YOU WITHIN 7 DAYS OF YOUR PROCEDUE WITH YOUR RESULTS. Do not assume everything is normal if you have not heard from DR. Tukker Byrns IN ONE WEEK, CALL HER OFFICE AT [phone].  SEEK IMMEDIATE MEDICAL ATTENTION AND CALL THE OFFICE: [phone] IF:  You have more than a spotting of blood in your stool.   Your belly is swollen (abdominal distention).   You are nauseated or vomiting.   You have a temperature over 101F.   You have abdominal pain or discomfort that is severe or gets worse throughout the day.  High-Fiber Diet A high-fiber diet changes your normal diet to include more whole grains, legumes, fruits, and vegetables. Changes in the diet involve replacing refined carbohydrates with unrefined foods. The calorie level of the diet is essentially unchanged. The Dietary Reference Intake (recommended amount) for adult males is 38 grams per day. For adult females, it is 25 grams per day. Pregnant and lactating women should consume 28 grams of fiber per day. Fiber is the intact part of a plant that is not broken down during digestion. Functional fiber is fiber that has been isolated from the plant to provide a beneficial effect in the body. PURPOSE  Increase stool bulk.   Ease and regulate bowel movements.   Lower cholesterol.  REDUCE RISK OF COLON CANCER  INDICATIONS THAT YOU NEED MORE FIBER  Constipation  and hemorrhoids.   Uncomplicated diverticulosis (intestine condition) and irritable bowel syndrome.   Weight management.   As a protective measure against hardening of the arteries (atherosclerosis), diabetes, and cancer.   GUIDELINES FOR INCREASING FIBER IN THE DIET  Start adding fiber to the diet slowly. A gradual increase of about 5 more grams (2 slices of whole-wheat bread, 2 servings of most fruits or vegetables, or 1 bowl of high-fiber cereal) per day is best. Too rapid an increase in fiber may result in  constipation, flatulence, and bloating.   Drink enough water and fluids to keep your urine clear or pale yellow. Water, juice, or caffeine-free drinks are recommended. Not drinking enough fluid may cause constipation.   Eat a variety of high-fiber foods rather than one type of fiber.   Try to increase your intake of fiber through using high-fiber foods rather than fiber pills or supplements that contain small amounts of fiber.   The goal is to change the types of food eaten. Do not supplement your present diet with high-fiber foods, but replace foods in your present diet.   INCLUDE A VARIETY OF FIBER SOURCES  Replace refined and processed grains with whole grains, canned fruits with fresh fruits, and incorporate other fiber sources. White rice, white breads, and most bakery goods contain little or no fiber.   Brown whole-grain rice, buckwheat oats, and many fruits and vegetables are all good sources of fiber. These include: broccoli, Brussels sprouts, cabbage, cauliflower, beets, sweet potatoes, white potatoes (skin on), carrots, tomatoes, eggplant, squash, berries, fresh fruits, and dried fruits.   Cereals appear to be the richest source of fiber. Cereal fiber is found in whole grains and bran. Bran is the fiber-rich outer coat of cereal grain, which is largely removed in refining. In whole-grain cereals, the bran remains. In breakfast cereals, the largest amount of fiber is found in those with "bran" in their names. The fiber content is sometimes indicated on the label.   You may need to include additional fruits and vegetables each day.   In baking, for 1 cup white flour, you may use the following substitutions:   1 cup whole-wheat flour minus 2 tablespoons.   1/2 cup white flour plus 1/2 cup whole-wheat flour.   Diverticulosis Diverticulosis is a common condition that develops when small pouches (diverticula) form in the wall of the colon. The risk of diverticulosis increases with age.  It happens more often in people who eat a low-fiber diet. Most individuals with diverticulosis have no symptoms. Those individuals with symptoms usually experience belly (abdominal) pain, constipation, or loose stools (diarrhea).  HOME CARE INSTRUCTIONS  Increase the amount of fiber in your diet as directed by your caregiver or dietician. This may reduce symptoms of diverticulosis.   Drink at least 6 to 8 glasses of water each day to prevent constipation.   Try not to strain when you have a bowel movement.   Avoiding nuts and seeds to prevent complications is NOT NECESSARY.     FOODS HAVING HIGH FIBER CONTENT INCLUDE:  Fruits. Apple, peach, pear, tangerine, raisins, prunes.   Vegetables. Brussels sprouts, asparagus, broccoli, cabbage, carrot, cauliflower, romaine lettuce, spinach, summer squash, tomato, winter squash, zucchini.   Starchy Vegetables. Baked beans, kidney beans, lima beans, split peas, lentils, potatoes (with skin).   Grains. Whole wheat bread, brown rice, bran flake cereal, plain oatmeal, white rice, shredded wheat, bran muffins.   SEEK IMMEDIATE MEDICAL CARE IF:  You develop increasing pain or severe bloating.  You have an oral temperature above 101F.   You develop vomiting or bowel movements that are bloody or black.   Hemorrhoids Hemorrhoids are dilated (enlarged) veins around the rectum. Sometimes clots will form in the veins. This makes them swollen and painful. These are called thrombosed hemorrhoids. Causes of hemorrhoids include:  Constipation.   Straining to have a bowel movement.   HEAVY LIFTING  HOME CARE INSTRUCTIONS  DO NOT SIT ON THE COMMODE AND READ.  Eat a well balanced diet and drink 6 to 8 glasses of water every day to avoid constipation. You may also use a bulk laxative.   Avoid straining to have bowel movements.   Keep anal area dry and clean.   Do not use a donut shaped pillow or sit on the toilet for long periods. This increases  blood pooling and pain.   Move your bowels when your body has the urge; this will require less straining and will decrease pain and pressure.

## 2015-04-29 NOTE — Op Note (Signed)
[facility] [address] [place], [address]   COLONOSCOPY PROCEDURE REPORT  PATIENT: James Daugherty, James Daugherty  MR#: [id number] BIRTHDATE: 1956/01/28 , 59  yrs. old GENDER: male ENDOSCOPIST: West Bali, MD REFERRED XB:[id number] Illene Regulus, M.D. PROCEDURE DATE:  May 28, 2015 PROCEDURE:   Colonoscopy, screening INDICATIONS:average risk patient for colon cancer. MEDICATIONS: Demerol 75 mg IV and Versed 5 mg IV  DESCRIPTION OF PROCEDURE:    Physical exam was performed.  Informed consent was obtained from the patient after explaining the benefits, risks, and alternatives to procedure.  The patient was connected to monitor and placed in left lateral position. Continuous oxygen was provided by nasal cannula and IV medicine administered through an indwelling cannula.  After administration of sedation and rectal exam, the patients rectum was intubated and the EC-[id number] ([id number])  colonoscope was advanced under direct visualization to the ileum.  The scope was removed slowly by carefully examining the color, texture, anatomy, and integrity mucosa on the way out.  The patient was recovered in endoscopy and discharged home in satisfactory condition. Estimated blood loss is zero unless otherwise noted in this procedure report.    COLON FINDINGS: There was mild diverticulosis noted in the descending colon, sigmoid colon, and ascending colon with associated tortuosity and muscular hypertrophy.  , The examination was otherwise normal.  , The examined terminal ileum appeared to be normal.  , and Small internal hemorrhoids were found.  PREP QUALITY: excellent. CECAL W/D TIME: 14       minutes COMPLICATIONS: None  ENDOSCOPIC IMPRESSION: 1.   Mild diverticulosis in the descending colon, sigmoid colon, and ascending colon 2.   Small internal hemorrhoids  RECOMMENDATIONS: DRINK WATER HIGH FIBER DIET NEXT TCS IN 10 YEARS      _______________________________ eSignedWest Bali, MD 05-28-15 11:02 AM   CPT CODES: ICD CODES:  The ICD and CPT codes recommended by this software are interpretations from the data that the clinical staff has captured with the software.  The verification of the translation of this report to the ICD and CPT codes and modifiers is the sole responsibility of the health care institution and practicing physician where this report was generated.  [employer] will not be held responsible for the validity of the ICD and CPT codes included on this report.  AMA assumes no liability for data contained or not contained herein. CPT is a Publishing rights manager of the [employer].

## 2015-04-29 NOTE — H&P (Signed)
  Primary Care Physician:  No PCP Per Patient Primary Gastroenterologist:  Dr. Oneida Alar  Pre-Procedure History & Physical: HPI:  James Daugherty is a 59 y.o. male here for [place].  Past Medical History  Diagnosis Date  . Hypertension   . Ureterolithiasis     No past surgical history on file.  Prior to Admission medications   Medication Sig Start Date End Date Taking? Authorizing Provider  aspirin EC 81 MG tablet Take 81 mg by mouth every other day.    Yes Historical Provider, MD  b complex vitamins tablet Take 1 tablet by mouth daily.   Yes Historical Provider, MD  BETA CAROTENE PO Take 1 capsule by mouth daily.   Yes Historical Provider, MD  Coenzyme Q10 (COQ10 PO) Take 1 tablet by mouth daily.   Yes Historical Provider, MD  folic acid (FOLVITE) 051 MCG tablet Take 800 mcg by mouth every other day.    Yes Historical Provider, MD  Multiple Vitamin (MULTIVITAMIN) tablet Take 1 tablet by mouth daily.   Yes Historical Provider, MD  VITAMIN A PO Take 1 tablet by mouth daily.   Yes Historical Provider, MD  Na Sulfate-K Sulfate-Mg Sulf (SUPREP BOWEL PREP) SOLN Take 1 kit by mouth as directed. 04/20/15   Danie Binder, MD    Allergies as of 04/20/2015  . (No Known Allergies)    No family history on file.  History   Social History  . Marital Status: Single    Spouse Name: N/A  . Number of Children: N/A  . Years of Education: N/A   Occupational History  . Not on file.   Social History Main Topics  . Smoking status: Never Smoker   . Smokeless tobacco: Not on file  . Alcohol Use: No  . Drug Use: No  . Sexual Activity: Not on file   Other Topics Concern  . Not on file   Social History Narrative  . No narrative on file    Review of Systems: See HPI, otherwise negative ROS   Physical Exam: There were no vitals taken for this visit. General:   Alert,  pleasant and cooperative in NAD Head:  Normocephalic and atraumatic. Neck:  Supple; Lungs:  Clear  throughout to auscultation.    Heart:  Regular rate and rhythm. Abdomen:  Soft, nontender and nondistended. Normal bowel sounds, without guarding, and without rebound.   Neurologic:  Alert and  oriented x4;  grossly normal neurologically.  Impression/Plan:     SCREENING  Plan:  1. TCS TODAY

## 2015-05-03 ENCOUNTER — Encounter (HOSPITAL_COMMUNITY): Payer: Self-pay | Admitting: Gastroenterology

## 2018-05-30 ENCOUNTER — Other Ambulatory Visit: Payer: Self-pay

## 2018-05-30 ENCOUNTER — Emergency Department (HOSPITAL_COMMUNITY): Payer: BC Managed Care – PPO

## 2018-05-30 ENCOUNTER — Emergency Department (HOSPITAL_COMMUNITY)
Admission: EM | Admit: 2018-05-30 | Discharge: 2018-05-31 | Disposition: A | Discharge status: Home / Self Care | Payer: BC Managed Care – PPO | Attending: Emergency Medicine | Admitting: Emergency Medicine

## 2018-05-30 ENCOUNTER — Encounter (HOSPITAL_COMMUNITY): Payer: Self-pay

## 2018-05-30 DIAGNOSIS — R072 Precordial pain: Secondary | ICD-10-CM | POA: Insufficient documentation

## 2018-05-30 DIAGNOSIS — Z7982 Long term (current) use of aspirin: Secondary | ICD-10-CM | POA: Insufficient documentation

## 2018-05-30 DIAGNOSIS — R079 Chest pain, unspecified: Secondary | ICD-10-CM | POA: Diagnosis present

## 2018-05-30 DIAGNOSIS — Z79899 Other long term (current) drug therapy: Secondary | ICD-10-CM | POA: Diagnosis not present

## 2018-05-30 DIAGNOSIS — I10 Essential (primary) hypertension: Secondary | ICD-10-CM | POA: Insufficient documentation

## 2018-05-30 LAB — CBC
HCT: 44.1 % (ref 39.0–52.0)
Hemoglobin: 14.8 g/dL (ref 13.0–17.0)
MCH: 27.5 pg (ref 26.0–34.0)
MCHC: 33.6 g/dL (ref 30.0–36.0)
MCV: 82 fL (ref 78.0–100.0)
PLATELETS: 200 10*3/uL (ref 150–400)
RBC: 5.38 MIL/uL (ref 4.22–5.81)
RDW: 14.6 % (ref 11.5–15.5)
WBC: 4.7 10*3/uL (ref 4.0–10.5)

## 2018-05-30 LAB — BASIC METABOLIC PANEL
Anion gap: 7 (ref 5–15)
BUN: 14 mg/dL (ref 8–23)
CO2: 29 mmol/L (ref 22–32)
CREATININE: 1.1 mg/dL (ref 0.61–1.24)
Calcium: 9.7 mg/dL (ref 8.9–10.3)
Chloride: 105 mmol/L (ref 98–111)
GFR calc non Af Amer: >60 mL/min (ref >60)
GLUCOSE: 92 mg/dL (ref 70–99)
Potassium: 3.9 mmol/L (ref 3.5–5.1)
Sodium: 141 mmol/L (ref 135–145)

## 2018-05-30 LAB — I-STAT TROPONIN, ED: Troponin i, poc: 0.01 ng/mL (ref 0.00–0.08)

## 2018-05-30 MED ORDER — ASPIRIN 81 MG PO CHEW
324.0000 mg | CHEWABLE_TABLET | Freq: Once | ORAL | Status: AC
  Administered 2018-05-30: 324 mg via ORAL
  Filled 2018-05-30: qty 4

## 2018-05-30 NOTE — ED Provider Notes (Signed)
[facility] EMERGENCY DEPARTMENT Provider Note   CSN: [id number] Arrival date & time: 05/30/18  2217     History   Chief Complaint Chief Complaint  Patient presents with  . Chest Pain    HPI James Daugherty is a 62 y.o. male.  The history is provided by the patient.  Chest Pain   This is a new problem. The current episode started 1 to 2 hours ago. The problem occurs constantly. The problem has been resolved. The pain is associated with rest. The pain is moderate. The pain does not radiate. Pertinent negatives include no abdominal pain, no back pain, no diaphoresis, no fever, no lower extremity edema, no shortness of breath and no weakness. He has tried nothing for the symptoms.  His past medical history is significant for hypertension.  pt presents with Chest pain.  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  While waiting in the ER, he began having chest pressure at rest.  He also reports he had tingling in his left arm for the past week.  The chest pain does not radiate to his arm.  He currently denies shortness of breath.  He denies diaphoresis on my exam. He has never had this pain before.  Past Medical History:  Diagnosis Date  . Heart palpitations   . Hypertension   . Meningitis   . Ureterolithiasis     Patient Active Problem List   Diagnosis Date Noted  . Special screening for malignant neoplasms, colon     Past Surgical History:  Procedure Laterality Date  . COLONOSCOPY N/A 04/29/2015   Procedure: COLONOSCOPY;  Surgeon: West Bali, MD;  Location: [facility] ENDO SUITE;  Service: Endoscopy;  Laterality: N/A;  10:45 - moved to 9:30 - office to notify        Home Medications    Prior to Admission medications   Medication Sig Start Date End Date Taking? Authorizing Provider  aspirin EC 81 MG tablet Take 81 mg by mouth every other day.     [provider]  b complex vitamins tablet Take 1 tablet by mouth daily.    [provider]    BETA CAROTENE PO Take 1 capsule by mouth daily.    [provider]  Coenzyme Q10 (COQ10 PO) Take 1 tablet by mouth daily.    [provider]  folic acid (FOLVITE) 800 MCG tablet Take 800 mcg by mouth every other day.     [provider]  Multiple Vitamin (MULTIVITAMIN) tablet Take 1 tablet by mouth daily.    [provider]  VITAMIN A PO Take 1 tablet by mouth daily.    [provider]    Family History No family history on file.  Social History Social History   Tobacco Use  . Smoking status: Never Smoker  . Smokeless tobacco: Never Used  Substance Use Topics  . Alcohol use: No  . Drug use: No     Allergies   Patient has no known allergies.   Review of Systems Review of Systems  Constitutional: Negative for diaphoresis and fever.  Respiratory: Negative for shortness of breath.   Cardiovascular: Positive for chest pain.  Gastrointestinal: Negative for abdominal pain.  Musculoskeletal: Negative for back pain.  Neurological: Negative for weakness.       Tingling  All other systems reviewed and are negative.    Physical Exam Updated Vital Signs BP (!) 153/90   Pulse 84   Temp 98.1 F (  36.7 C) (Oral)   Resp 11   Ht 1.753 m (5\' 9" )   Wt 90.7 kg   SpO2 97%   BMI 29.53 kg/m   Physical Exam CONSTITUTIONAL: Well developed/well nourished, appears younger than stated age HEAD: Normocephalic/atraumatic EYES: EOMI/PERRL ENMT: Mucous membranes moist NECK: supple no meningeal signs SPINE/BACK:entire spine nontender CV: S1/S2 noted, no murmurs/rubs/gallops noted LUNGS: Lungs are clear to auscultation bilaterally, no apparent distress ABDOMEN: soft, nontender, no rebound or guarding, bowel sounds noted throughout abdomen GU:no cva tenderness NEURO: Pt is awake/alert/appropriate, moves all extremitiesx4.  No facial droop.   Patient reports "tingling" in left bicep and left hand only, it does not extend throughout the left  upper extremity No arm drift noted.  No focal weakness noted. EXTREMITIES: pulses normal/equal x4, full ROM SKIN: warm, color normal PSYCH: no abnormalities of mood noted, alert and oriented to situation   ED Treatments / Results  Labs (all labs ordered are listed, but only abnormal results are displayed) Labs Reviewed  BASIC METABOLIC PANEL  CBC  I-STAT TROPONIN, ED    EKG EKG Interpretation  Date/Time:  Thursday May 30 2018 22:22:30 EDT Ventricular Rate:  88 PR Interval:    QRS Duration: 93 QT Interval:  361 QTC Calculation: 437 R Axis:   44 Text Interpretation:  Sinus rhythm Minimal ST depression, inferior leads Baseline wander in lead(s) V1 Abnormal ekg Confirmed by Zadie Rhine ([id number]) on 05/30/2018 11:05:17 PM   Radiology Dg Chest 2 View  Result Date: 05/30/2018 CLINICAL DATA:  Chest pain EXAM: CHEST - 2 VIEW COMPARISON:  11/13/2009 FINDINGS: The heart size and mediastinal contours are within normal limits. Both lungs are clear. The visualized skeletal structures are unremarkable. IMPRESSION: No active cardiopulmonary disease. Electronically Signed   By: Jasmine Pang M.D.   On: 05/30/2018 23:31    Procedures Procedures    Medications Ordered in ED Medications  aspirin chewable tablet 324 mg (324 mg Oral Given 05/30/18 2341)     Initial Impression / Assessment and Plan / ED Course  I have reviewed the triage vital signs and the nursing notes.  Pertinent labs & imaging results that were available during my care of the patient were reviewed by me and considered in my medical decision making (see chart for details).     11:22 PM Pt stable at this time with improved CP HEART score approximately 3 Workup pending at this time 12:30 AM work-up unremarkable.  Patient feels improved. I advised further evaluation, including at least a repeat troponin.  He declines at this time.  I discussed the risks of going home, and patient continues to decline further  evaluation.  We discussed at length strict ER return precautions. Final Clinical Impressions(s) / ED Diagnoses   Final diagnoses:  Precordial pain    ED Discharge Orders    None       Zadie Rhine, MD 05/31/18 0030

## 2018-05-30 NOTE — ED Triage Notes (Addendum)
Pt was sitting with a prisoner from the [facility] when he had sudden onset of mid sternal chest pain.  Pt became diaphoretic at the same time and having some tingling in his left arm.  Pt states he has had tingling for about a week, rates chest pain 4/10.

## 2018-05-31 NOTE — Discharge Instructions (Addendum)

## 2019-09-07 IMAGING — DX DG CHEST 2V
2 series · 2 of 2 positions shown · non-contrast
Comparison: 11/13/2009

CLINICAL DATA: Chest pain

EXAM:
CHEST - 2 VIEW

[chest pa]
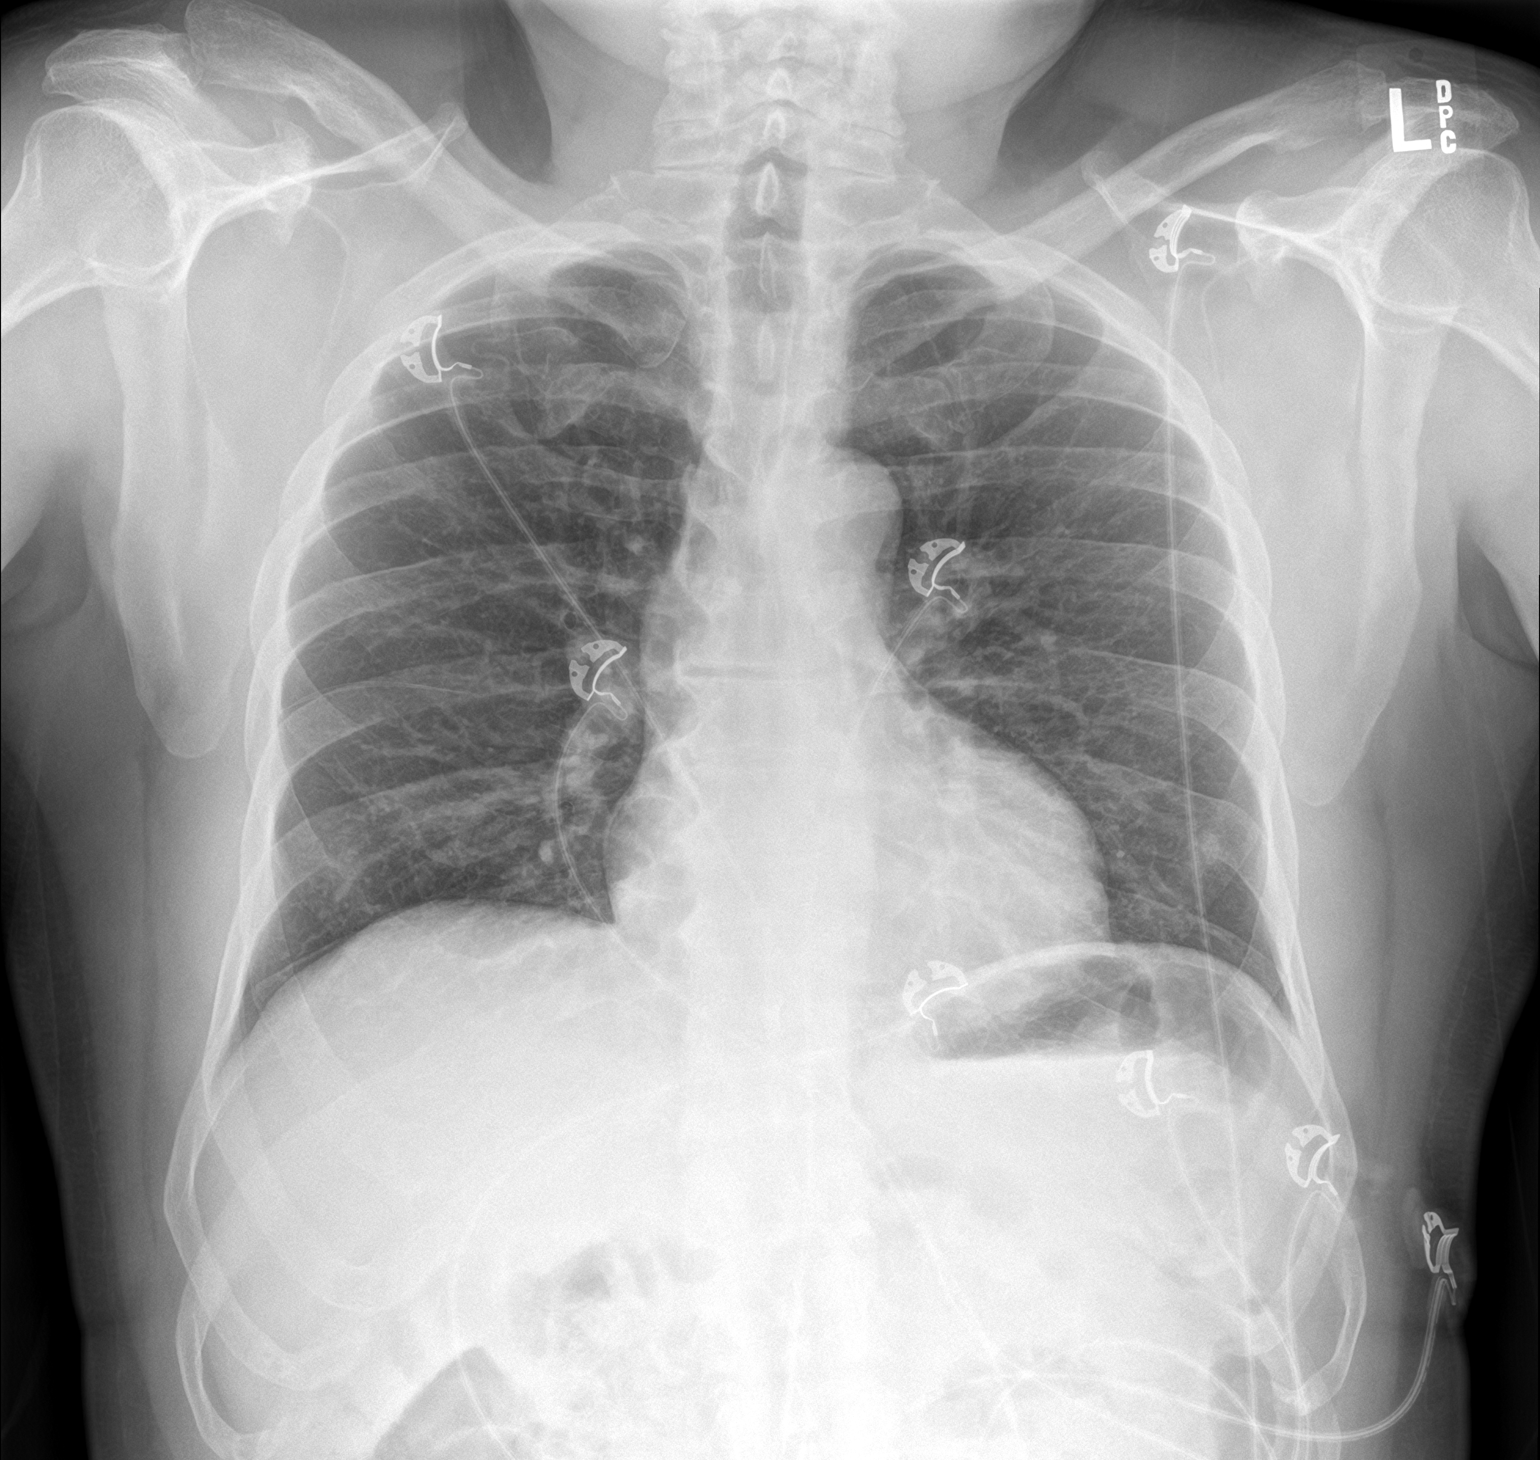

[chest lat]
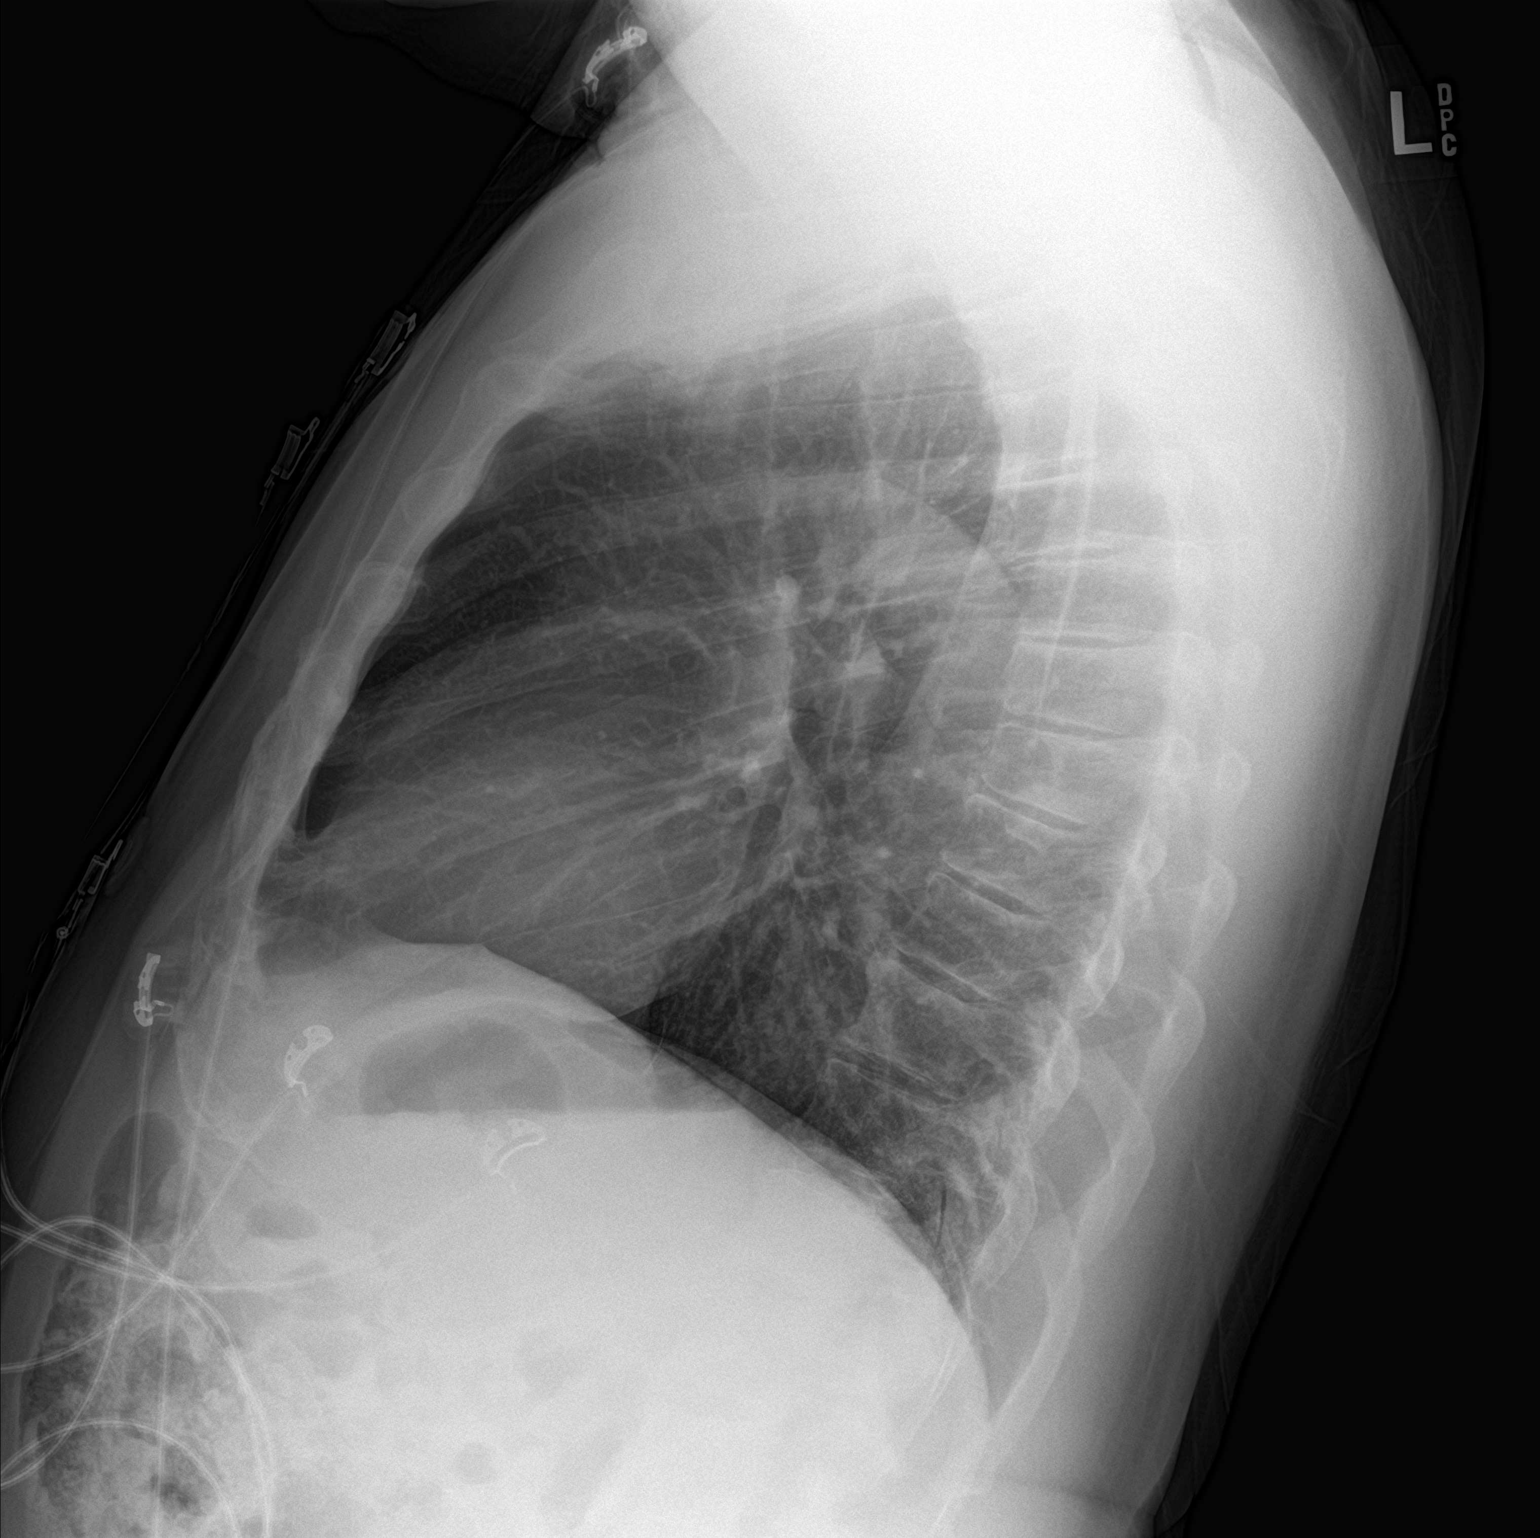

[2 of 2 positions shown; findings below may reference images not displayed]

FINDINGS: The heart size and mediastinal contours are within normal limits.
Both lungs are clear. The visualized skeletal structures are
unremarkable.
IMPRESSION: No active cardiopulmonary disease.

## 2020-03-17 ENCOUNTER — Emergency Department
Admission: EM | Admit: 2020-03-17 | Discharge: 2020-03-18 | Disposition: A | Discharge status: Home / Self Care | Payer: Medicare Other | Attending: Emergency Medicine | Admitting: Emergency Medicine

## 2020-03-17 ENCOUNTER — Other Ambulatory Visit: Payer: Self-pay

## 2020-03-17 ENCOUNTER — Emergency Department: Payer: Medicare Other

## 2020-03-17 DIAGNOSIS — R509 Fever, unspecified: Secondary | ICD-10-CM | POA: Diagnosis not present

## 2020-03-17 DIAGNOSIS — Z5321 Procedure and treatment not carried out due to patient leaving prior to being seen by health care provider: Secondary | ICD-10-CM | POA: Insufficient documentation

## 2020-03-17 LAB — COMPREHENSIVE METABOLIC PANEL
ALT: 27 U/L (ref 0–44)
AST: 34 U/L (ref 15–41)
Albumin: 4.4 g/dL (ref 3.5–5.0)
Alkaline Phosphatase: 91 U/L (ref 38–126)
Anion gap: 11 (ref 5–15)
BUN: 14 mg/dL (ref 8–23)
CO2: 27 mmol/L (ref 22–32)
Calcium: 9.1 mg/dL (ref 8.9–10.3)
Chloride: 97 mmol/L — ABNORMAL LOW (ref 98–111)
Creatinine, Ser: 1.28 mg/dL — ABNORMAL HIGH (ref 0.61–1.24)
GFR calc Af Amer: >60 mL/min (ref >60)
GFR calc non Af Amer: 58 mL/min — ABNORMAL LOW (ref >60)
Glucose, Bld: 112 mg/dL — ABNORMAL HIGH (ref 70–99)
Potassium: 3.9 mmol/L (ref 3.5–5.1)
Sodium: 135 mmol/L (ref 135–145)
Total Bilirubin: 0.9 mg/dL (ref 0.3–1.2)
Total Protein: 8.2 g/dL — ABNORMAL HIGH (ref 6.5–8.1)

## 2020-03-17 LAB — CBC WITH DIFFERENTIAL/PLATELET
Abs Immature Granulocytes: 0.01 10*3/uL (ref 0.00–0.07)
Basophils Absolute: 0 10*3/uL (ref 0.0–0.1)
Basophils Relative: 1 %
Eosinophils Absolute: 0 10*3/uL (ref 0.0–0.5)
Eosinophils Relative: 0 %
HCT: 47.6 % (ref 39.0–52.0)
Hemoglobin: 16.2 g/dL (ref 13.0–17.0)
Immature Granulocytes: 0 %
Lymphocytes Relative: 25 %
Lymphs Abs: 0.8 10*3/uL (ref 0.7–4.0)
MCH: 26.6 pg (ref 26.0–34.0)
MCHC: 34 g/dL (ref 30.0–36.0)
MCV: 78.3 fL — ABNORMAL LOW (ref 80.0–100.0)
Monocytes Absolute: 0.3 10*3/uL (ref 0.1–1.0)
Monocytes Relative: 8 %
Neutro Abs: 2.1 10*3/uL (ref 1.7–7.7)
Neutrophils Relative %: 66 %
Platelets: 147 10*3/uL — ABNORMAL LOW (ref 150–400)
RBC: 6.08 MIL/uL — ABNORMAL HIGH (ref 4.22–5.81)
RDW: 14.6 % (ref 11.5–15.5)
WBC: 3.1 10*3/uL — ABNORMAL LOW (ref 4.0–10.5)
nRBC: 0 % (ref 0.0–0.2)

## 2020-03-17 LAB — LACTIC ACID, PLASMA: Lactic Acid, Venous: 1.5 mmol/L (ref 0.5–1.9)

## 2020-03-17 MED ORDER — ACETAMINOPHEN 325 MG PO TABS: 650.0000 mg | ORAL_TABLET | Freq: Once | ORAL | Status: DC

## 2020-03-17 NOTE — ED Triage Notes (Signed)
Pt comes via POV from home with c/o fever and chills for 3 days. Pt denies any N/V.abdominal pain or any other complaints.

## 2020-03-18 ENCOUNTER — Telehealth: Payer: Self-pay | Admitting: Emergency Medicine

## 2020-03-18 NOTE — Telephone Encounter (Signed)
Called patient due to lwot to inquire about condition and follow up plans.  He said he had to leave due to ride.  He does go to [facility] as pcp.  I asked him to call them and see if they can get him in for exam.  I told him that his lab and xray results are available to his doctor and he should inform them of that as well.  He agrees to call them now.

## 2020-03-18 NOTE — ED Notes (Signed)
No answer when called several times from lobby 

## 2021-06-25 IMAGING — CR DG CHEST 2V
1 series · 2 of 2 positions shown · non-contrast
Comparison: 05/30/2018

CLINICAL DATA: Fever and chills

EXAM:
CHEST - 2 VIEW

[Series 1: dg chest 2 view · 0.14mm/px · 2 of 2 slices shown]
[im 1/2]
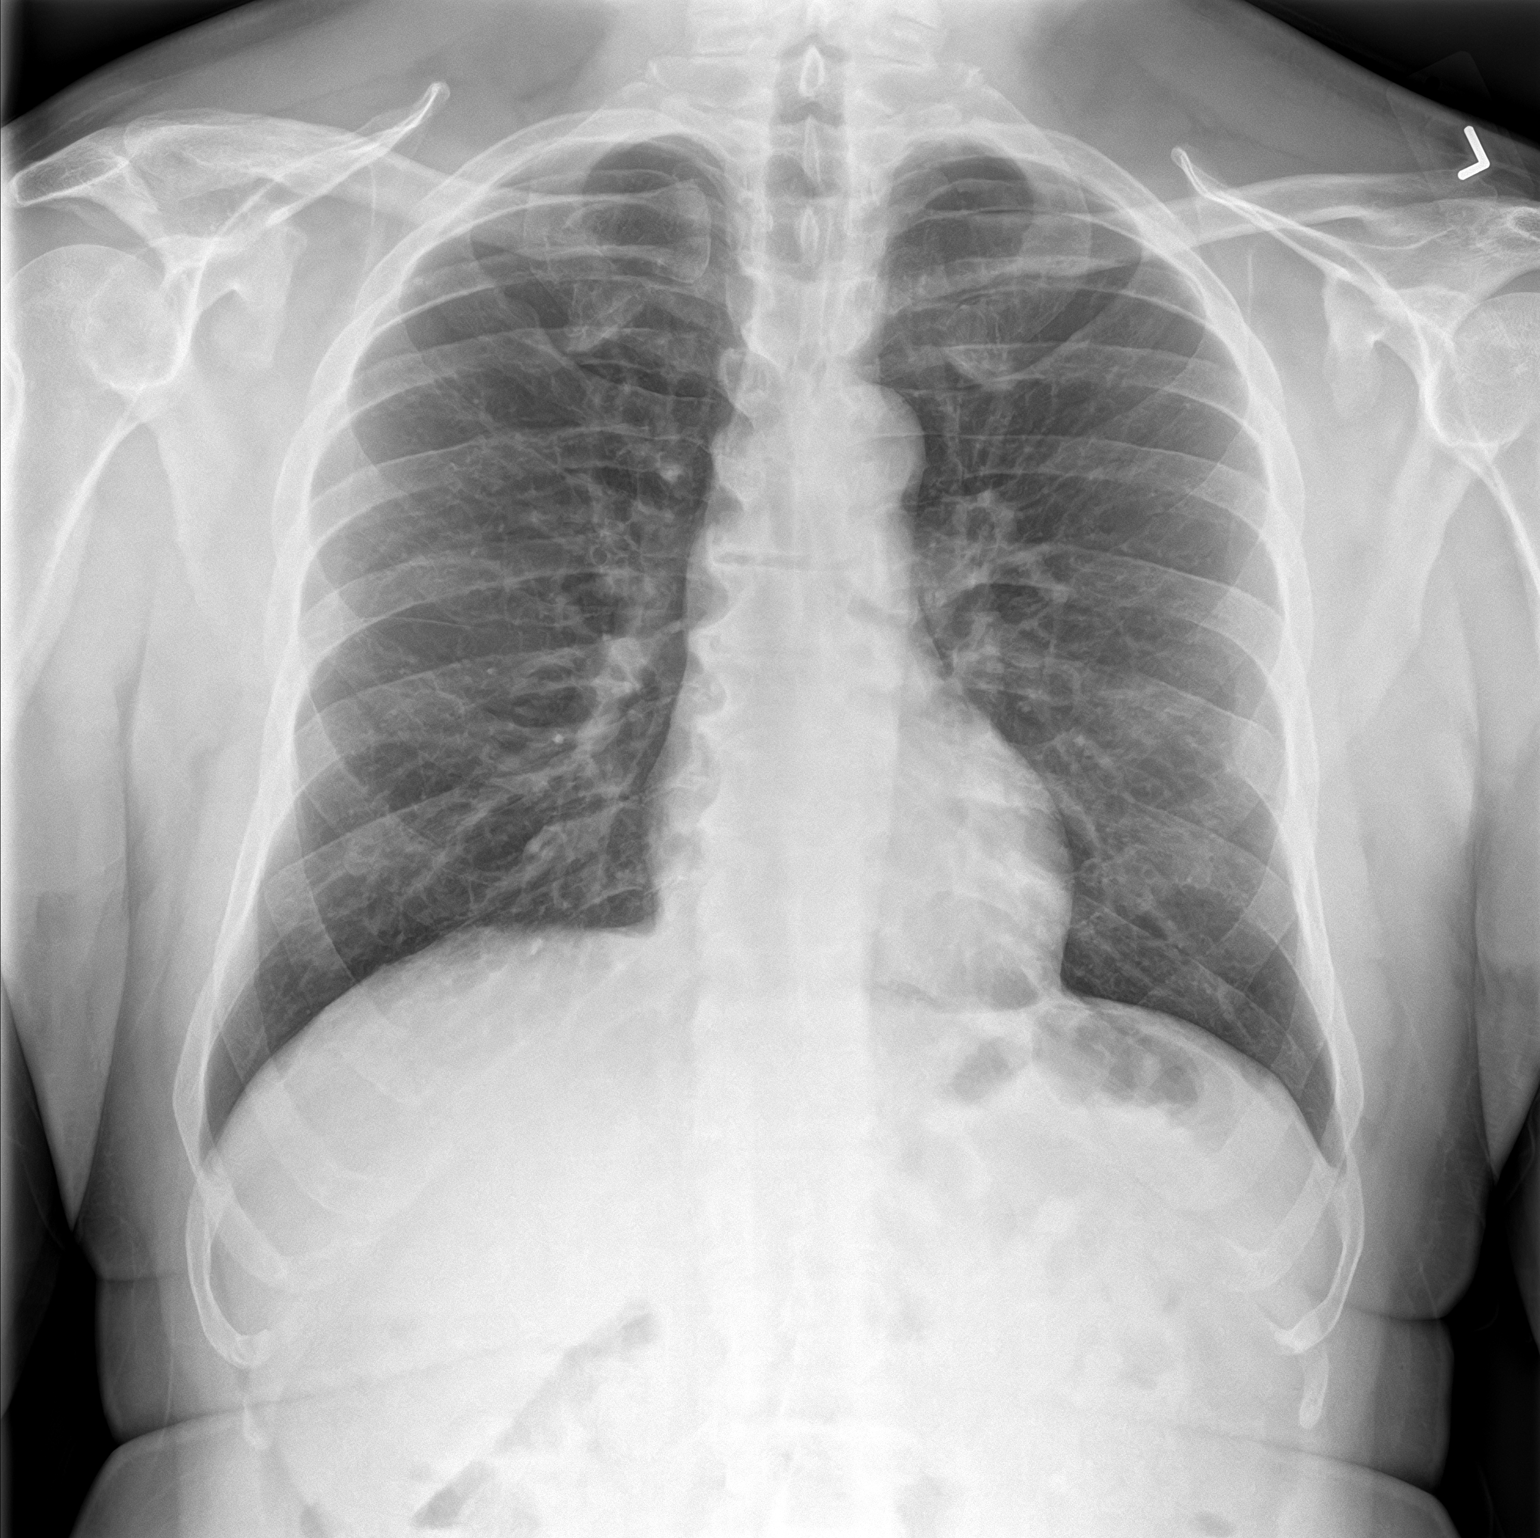
[im 2/2]
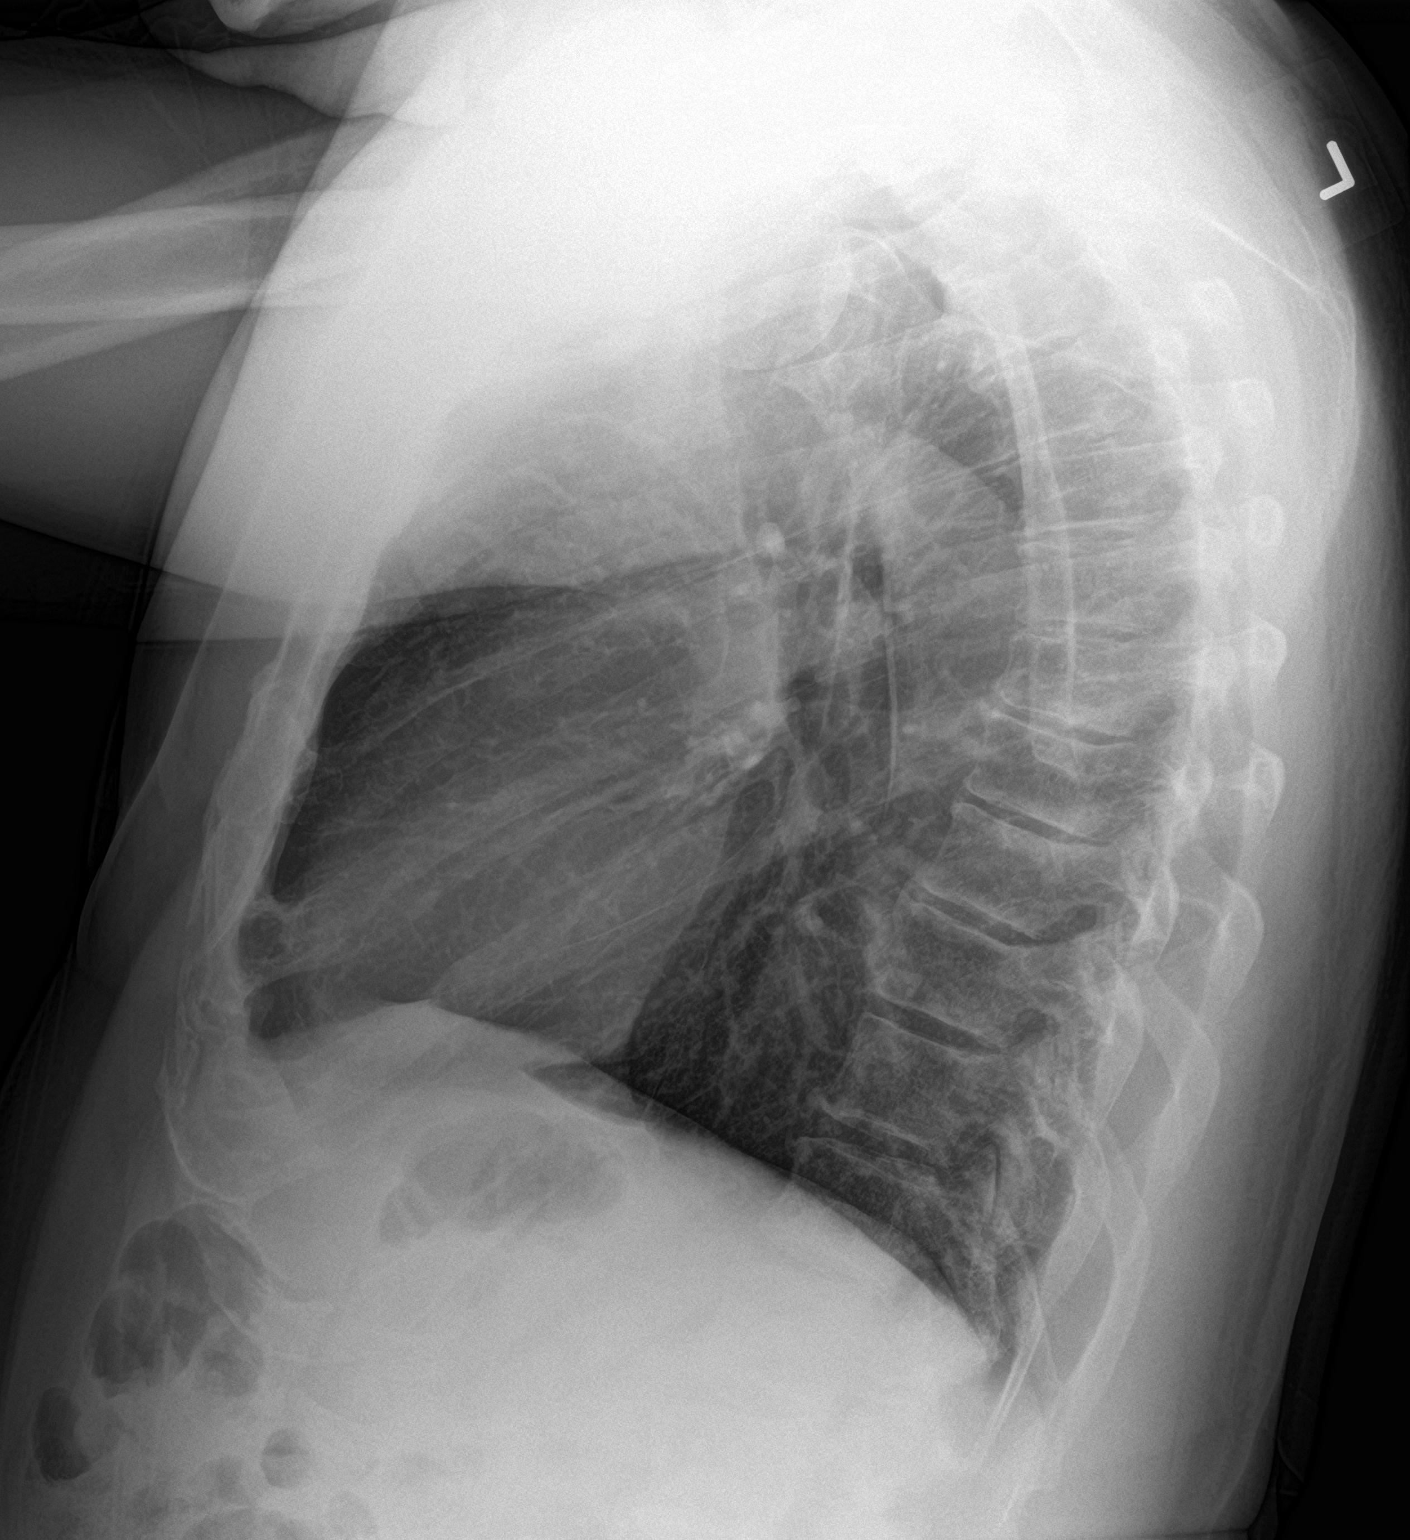

[2 of 2 positions shown; findings below may reference images not displayed]

FINDINGS: The heart size and mediastinal contours are within normal limits.
Both lungs are clear. The visualized skeletal structures are
unremarkable.
IMPRESSION: No active cardiopulmonary disease.
